# Patient Record
Sex: Male | Born: 1960 | Race: Black or African American | Hispanic: No | Marital: Married | Smoking: Never smoker
Health system: Southern US, Community
[De-identification: ages and names within clinical notes are randomized; demographics above are authoritative.]

## PROBLEM LIST (undated history)

## (undated) DIAGNOSIS — M1990 Unspecified osteoarthritis, unspecified site: Secondary | ICD-10-CM

## (undated) DIAGNOSIS — R9431 Abnormal electrocardiogram [ECG] [EKG]: Secondary | ICD-10-CM

## (undated) HISTORY — PX: ACHILLES TENDON REPAIR: SUR1153

## (undated) HISTORY — PX: TOTAL HIP ARTHROPLASTY: SHX124

---

## 2009-10-29 ENCOUNTER — Encounter: Admission: RE | Admit: 2009-10-29 | Discharge: 2009-10-29 | Payer: Self-pay | Admitting: Orthopedic Surgery

## 2010-03-23 ENCOUNTER — Ambulatory Visit (HOSPITAL_COMMUNITY)
Admission: RE | Admit: 2010-03-23 | Discharge: 2010-03-23 | Payer: Self-pay | Source: Home / Self Care | Attending: Orthopedic Surgery | Admitting: Orthopedic Surgery

## 2010-03-23 LAB — ABO/RH: ABO/RH(D): O POS

## 2010-03-23 LAB — TYPE AND SCREEN
ABO/RH(D): O POS
Antibody Screen: NEGATIVE

## 2010-04-12 LAB — URINALYSIS, ROUTINE W REFLEX MICROSCOPIC
Bilirubin Urine: NEGATIVE
Hgb urine dipstick: NEGATIVE
Ketones, ur: NEGATIVE mg/dL
Nitrite: NEGATIVE
Protein, ur: NEGATIVE mg/dL
Specific Gravity, Urine: 1.024 (ref 1.005–1.030)
Urine Glucose, Fasting: NEGATIVE mg/dL
Urobilinogen, UA: 0.2 mg/dL (ref 0.0–1.0)
pH: 6.5 (ref 5.0–8.0)

## 2010-04-12 LAB — COMPREHENSIVE METABOLIC PANEL
ALT: 52 U/L (ref 0–53)
AST: 34 U/L (ref 0–37)
Albumin: 4.2 g/dL (ref 3.5–5.2)
Alkaline Phosphatase: 57 U/L (ref 39–117)
BUN: 12 mg/dL (ref 6–23)
CO2: 30 mEq/L (ref 19–32)
Calcium: 9.7 mg/dL (ref 8.4–10.5)
Chloride: 104 mEq/L (ref 96–112)
Creatinine, Ser: 1.2 mg/dL (ref 0.4–1.5)
GFR calc Af Amer: >60 mL/min (ref >60)
GFR calc non Af Amer: >60 mL/min (ref >60)
Glucose, Bld: 126 mg/dL — ABNORMAL HIGH (ref 70–99)
Potassium: 4 mEq/L (ref 3.5–5.1)
Sodium: 143 mEq/L (ref 135–145)
Total Bilirubin: 0.7 mg/dL (ref 0.3–1.2)
Total Protein: 7.3 g/dL (ref 6.0–8.3)

## 2010-04-12 LAB — CBC
HCT: 42.1 % (ref 39.0–52.0)
Hemoglobin: 14.4 g/dL (ref 13.0–17.0)
MCH: 28.7 pg (ref 26.0–34.0)
MCHC: 34.2 g/dL (ref 30.0–36.0)
MCV: 83.9 fL (ref 78.0–100.0)
Platelets: 210 10*3/uL (ref 150–400)
RBC: 5.02 MIL/uL (ref 4.22–5.81)
RDW: 13.2 % (ref 11.5–15.5)
WBC: 5.3 10*3/uL (ref 4.0–10.5)

## 2010-04-12 LAB — PROTIME-INR
INR: 1.01 (ref 0.00–1.49)
Prothrombin Time: 13.5 seconds (ref 11.6–15.2)

## 2010-04-12 LAB — APTT: aPTT: 39 seconds — ABNORMAL HIGH (ref 24–37)

## 2010-04-15 ENCOUNTER — Inpatient Hospital Stay (HOSPITAL_COMMUNITY)
Admission: RE | Admit: 2010-04-15 | Discharge: 2010-04-19 | Disposition: A | Discharge status: Home / Self Care | Payer: Self-pay | Source: Home / Self Care | Attending: Orthopedic Surgery | Admitting: Orthopedic Surgery

## 2010-04-15 LAB — TYPE AND SCREEN
ABO/RH(D): O POS
Antibody Screen: NEGATIVE

## 2010-04-15 NOTE — H&P (Addendum)
NAMEDUSHAWN, PUSEY NO.:  [id number]  MEDICAL RECORD NO.:  [id number]          PATIENT TYPE:  INP  LOCATION:  NA                           FACILITY:  [facility]  PHYSICIAN:  Ollen Gross, M.D.    DATE OF BIRTH:  1960/07/20  DATE OF ADMISSION: DATE OF DISCHARGE:                             HISTORY & PHYSICAL   CHIEF COMPLAINT:  Left hip pain.  BRIEF HISTORY:  Mr. Vandeventer is a 50 year old male who has been seen by Dr. Lequita Halt for ongoing pain in his left hip.  He was seen in second opinion earlier last fall and was found to have advanced arthritis which has been progressing over the past year.  He is felt to be a good candidate for surgery.  Risks and benefits have been discussed with the patient and he would like to proceed with surgery.  He has been seen preoperatively by Dr. Neita Carp and felt to be safe to go ahead with surgery.  MEDICATION ALLERGIES:  No known drug allergies.  PRIMARY CARE PHYSICIAN:  Dr. Fara Chute  CURRENT MEDICATIONS:  Ultracet.  PAST MEDICAL HISTORY: 1. Hypertension. 2. Arthritis.  PAST SURGICAL HISTORY:  Achilles tendon repair in 2001.  FAMILY HISTORY:  Father passed at age of 62 of myocardial infarction. Mother is 32, she has hypertension.  SOCIAL HISTORY:  The patient is married.  He works in Firefighter.  He denies past or present use of alcohol or tobacco products.  He has 2 children.  Lives at home with his family and he does plan to go home following his hospital stay.  REVIEW OF SYSTEMS:  GENERAL:  Negative for fevers, chills or weight change.  HEENT/NEURO:  Negative for headache or blurred vision. DERMATOLOGIC:  Negative for rash or lesion.  RESPIRATORY:  Negative for shortness of breath at rest or on exertion.  CARDIOVASCULAR:  Negative for chest pain or palpitation.  GI:  Negative for nausea, vomiting, diarrhea.  GU:  Negative for hematuria, dysuria.  Musculoskeletal: Positive for joint pain.  PHYSICAL  EXAMINATION:  VITAL SIGNS:  Pulse 68, respirations 18, blood pressure 140/82 in the left arm. GENERAL:  Mr. Scharnhorst is alert and oriented x3.  He is a pleasant 50- year-old male with stated height of 5 feet 8 inches and a weight of 170 pounds.  He is in no distress. HEENT:  Normocephalic, atraumatic.  Extraocular movements intact. NECK:  Supple.  Full range of motion without lymphadenopathy. CHEST:  Lungs are clear to auscultation bilaterally without wheezes, rhonchi or rales. HEART:  Regular rate and rhythm without murmur. ABDOMEN:  Bowel sounds present in all 4 quadrants.  Abdomen is soft, nontender, nondistended to palpation. EXTREMITIES:  Left hip flexion to 95 degrees, no internal rotation, 15 degrees of external rotation, and about 20 degrees of abduction. SKIN:  Unremarkable. NEUROLOGIC:  Intact. PERIPHERAL VASCULAR:  Carotid pulses 2+ bilaterally without bruit.  RADIOGRAPHS:  AP and lateral views of the left hip reveal bone-on-bone progressive changes and cystic changes found on MRI.  IMPRESSION:  End-stage arthritis, left hip.  PLAN:  Left total hip arthroplasty to be performed by Dr.  Aluisio. Please note that Mr. Shiller was scheduled previously for the surgery but when he came in day of surgery, EKG revealed some abnormality.  He has since then cleared by Cardiology and is felt to be safe to go ahead with the left total hip arthroplasty.     Rozell Searing, PAC   ______________________________ Ollen Gross, M.D.    LD/MEDQ  D:  04/14/2010  T:  04/15/2010  Job:  [id number]  cc:   Fara Chute Fax: [phone]  Electronically Signed by Rozell Searing  on 04/15/2010 02:28:19 PM Electronically Signed by Ollen Gross M.D. on 04/20/2010 03:38:32 PM

## 2010-04-16 LAB — BASIC METABOLIC PANEL
BUN: 10 mg/dL (ref 6–23)
CO2: 29 mEq/L (ref 19–32)
Calcium: 8.9 mg/dL (ref 8.4–10.5)
Chloride: 102 mEq/L (ref 96–112)
Creatinine, Ser: 1.21 mg/dL (ref 0.4–1.5)
GFR calc Af Amer: >60 mL/min (ref >60)
GFR calc non Af Amer: >60 mL/min (ref >60)
Glucose, Bld: 128 mg/dL — ABNORMAL HIGH (ref 70–99)
Potassium: 4.5 mEq/L (ref 3.5–5.1)
Sodium: 138 mEq/L (ref 135–145)

## 2010-04-16 LAB — CBC
HCT: 35.3 % — ABNORMAL LOW (ref 39.0–52.0)
Hemoglobin: 11.6 g/dL — ABNORMAL LOW (ref 13.0–17.0)
MCH: 27.8 pg (ref 26.0–34.0)
MCHC: 32.9 g/dL (ref 30.0–36.0)
MCV: 84.7 fL (ref 78.0–100.0)
Platelets: 224 10*3/uL (ref 150–400)
RBC: 4.17 MIL/uL — ABNORMAL LOW (ref 4.22–5.81)
RDW: 13.6 % (ref 11.5–15.5)
WBC: 9.6 10*3/uL (ref 4.0–10.5)

## 2010-04-17 LAB — D-DIMER, QUANTITATIVE: D-Dimer, Quant: 1.03 ug/mL-FEU — ABNORMAL HIGH (ref 0.00–0.48)

## 2010-04-17 LAB — CARDIAC PANEL(CRET KIN+CKTOT+MB+TROPI)
CK, MB: 1.8 ng/mL (ref 0.3–4.0)
Relative Index: 0.3 (ref 0.0–2.5)
Total CK: 596 U/L — ABNORMAL HIGH (ref 7–232)
Troponin I: 0.01 ng/mL (ref 0.00–0.06)

## 2010-04-17 LAB — CBC
HCT: 32 % — ABNORMAL LOW (ref 39.0–52.0)
Hemoglobin: 10.5 g/dL — ABNORMAL LOW (ref 13.0–17.0)
MCH: 27.9 pg (ref 26.0–34.0)
MCHC: 32.8 g/dL (ref 30.0–36.0)
MCV: 85.1 fL (ref 78.0–100.0)
Platelets: 203 10*3/uL (ref 150–400)
RBC: 3.76 MIL/uL — ABNORMAL LOW (ref 4.22–5.81)
RDW: 13.8 % (ref 11.5–15.5)
WBC: 12 10*3/uL — ABNORMAL HIGH (ref 4.0–10.5)

## 2010-04-17 LAB — BASIC METABOLIC PANEL
BUN: 6 mg/dL (ref 6–23)
CO2: 32 mEq/L (ref 19–32)
Calcium: 8.7 mg/dL (ref 8.4–10.5)
Chloride: 104 mEq/L (ref 96–112)
Creatinine, Ser: 1.27 mg/dL (ref 0.4–1.5)
GFR calc Af Amer: >60 mL/min (ref >60)
GFR calc non Af Amer: >60 mL/min (ref >60)
Glucose, Bld: 133 mg/dL — ABNORMAL HIGH (ref 70–99)
Potassium: 4.2 mEq/L (ref 3.5–5.1)
Sodium: 141 mEq/L (ref 135–145)

## 2010-04-17 LAB — TSH: TSH: 1.147 u[IU]/mL (ref 0.350–4.500)

## 2010-04-18 LAB — BASIC METABOLIC PANEL
BUN: 5 mg/dL — ABNORMAL LOW (ref 6–23)
CO2: 31 mEq/L (ref 19–32)
Calcium: 8.7 mg/dL (ref 8.4–10.5)
Chloride: 105 mEq/L (ref 96–112)
Creatinine, Ser: 1.15 mg/dL (ref 0.4–1.5)
GFR calc Af Amer: >60 mL/min (ref >60)
GFR calc non Af Amer: >60 mL/min (ref >60)
Glucose, Bld: 133 mg/dL — ABNORMAL HIGH (ref 70–99)
Potassium: 4 mEq/L (ref 3.5–5.1)
Sodium: 143 mEq/L (ref 135–145)

## 2010-04-18 LAB — CBC
HCT: 30.4 % — ABNORMAL LOW (ref 39.0–52.0)
Hemoglobin: 10 g/dL — ABNORMAL LOW (ref 13.0–17.0)
MCH: 28.1 pg (ref 26.0–34.0)
MCHC: 32.9 g/dL (ref 30.0–36.0)
MCV: 85.4 fL (ref 78.0–100.0)
Platelets: 201 10*3/uL (ref 150–400)
RBC: 3.56 MIL/uL — ABNORMAL LOW (ref 4.22–5.81)
RDW: 13.7 % (ref 11.5–15.5)
WBC: 11.2 10*3/uL — ABNORMAL HIGH (ref 4.0–10.5)

## 2010-04-18 LAB — CARDIAC PANEL(CRET KIN+CKTOT+MB+TROPI)
CK, MB: 1.4 ng/mL (ref 0.3–4.0)
CK, MB: 0.9 ng/mL (ref 0.3–4.0)
Relative Index: 0.3 (ref 0.0–2.5)
Relative Index: 0.3 (ref 0.0–2.5)
Total CK: 458 U/L — ABNORMAL HIGH (ref 7–232)
Total CK: 353 U/L — ABNORMAL HIGH (ref 7–232)
Troponin I: 0.02 ng/mL (ref 0.00–0.06)
Troponin I: 0.02 ng/mL (ref 0.00–0.06)

## 2010-04-18 LAB — MAGNESIUM: Magnesium: 2 mg/dL (ref 1.5–2.5)

## 2010-04-20 NOTE — Op Note (Signed)
Jacob Henderson, Jacob Henderson NO.:  [id number]  MEDICAL RECORD NO.:  [id number]          PATIENT TYPE:  INP  LOCATION:  1605                         FACILITY:  [facility]  PHYSICIAN:  Ollen Gross, M.D.    DATE OF BIRTH:  09/20/1960  DATE OF PROCEDURE:  04/15/2010 DATE OF DISCHARGE:                              OPERATIVE REPORT   PREOPERATIVE DIAGNOSIS:  Osteoarthritis, left hip.  POSTOPERATIVE DIAGNOSIS:  Osteoarthritis, left hip.  PROCEDURE:  Left total hip arthroplasty.  SURGEON:  Ollen Gross, MD  ASSISTANT:  Alexzandrew L. Julien Girt, PA-C  ANESTHESIA:  General.  ESTIMATED BLOOD LOSS:  450.  DRAIN:  Hemovac x1.  COMPLICATIONS:  None.  CONDITION:  Stable to Recovery.  BRIEF CLINICAL NOTE:  Jacob Henderson is a 50 year old male with advanced end- stage arthritis of the left hip with progressively worsening pain and dysfunction.  He presents now for left total hip arthroplasty.  PROCEDURE IN DETAIL:  After successful administration of general anesthetic, the patient was placed in right lateral decubitus position with left side up and held with the hip positioner.  The left lower extremity was isolated from his perineum with plastic drapes and prepped and draped in a usual sterile fashion.  Short posterolateral incision was made with a 10 blade through subcutaneous tissue to the level of the fascia lata, which was incised in line with the skin incision.  The sciatic nerve was palpated and protected and short rotators and capsule were isolated off the femur.  Hip was dislocated and center of the femoral head was marked.  Trial prosthesis placed such that the center of the trial head corresponds to the center of his native femoral head. Osteotomy was marked on the femoral neck and osteotomy made with an oscillating saw.  Femoral head was removed and then the retractors were placed to gain access to the proximal femur.  A starter reamer was passed and then the canal was  thoroughly irrigated to remove fatty contents.  Axial reaming was performed to 13.5 mm, proximal reaming to 5F, and the sleeve machined to a large.  An 5F large trial sleeve was placed.  The femur was retracted anteriorly to gain acetabular exposure. Acetabular retractors were placed and labrum and osteophytes removed. Acetabular reaming was performed to 51 mm and then a 52 mm Pinnacle acetabular shell was impacted into anatomic position with outstanding purchase.  We did not need any additional dome screw fixation.  Apex hole eliminator was placed and then the 36 mm Neutral Ultamet metal liner was placed for metal-on-metal hip replacement.  We then placed trial 18 x 13 stem, 36 +8 neck matching native anteversion.  A 36 +0 head was placed.  Hip was reduced with outstanding stability.  There was full extension, full external rotation, 70 degrees of flexion, 40 degrees of abduction, 90 degrees of internal rotation, then 90 degrees of flexion, and 70 degrees of internal rotation.  By placing the left leg on top of the right, I felt as though the leg lengths were equal. Hip was then dislocated and trials were removed.  Permanent 5F large sleeve was placed, 18 x  13 stem, 36 +8 neck matching native anteversion. A 36 +0 head was placed and the hip was reduced with the same stability parameters.  Wound was copiously irrigated with saline solution and the capsule and rotators reattached to the femur through drill holes with Ethibond suture.  Fascia lata was closed over Hemovac drain with interrupted #1 Vicryl and subcutaneous closed with #1-0 and #2-0 Vicryl and subcuticular with running 4-0 Monocryl.  Catheter for the Marcaine pain pump was placed and the pump was initiated.  Incision was cleaned and dried and Steri-Strips and a bulky sterile dressing applied. Hemovac hooked to suction.  He was placed into a knee immobilizer, awakened, and transported to Recovery in stable  condition.     Ollen Gross, M.D.     FA/MEDQ  D:  04/15/2010  T:  04/16/2010  Job:  [id number]  Electronically Signed by Ollen Gross M.D. on 04/20/2010 03:38:37 PM

## 2010-05-05 NOTE — H&P (Signed)
NAMEWOODWARD, KLEM NO.:  [id number]  MEDICAL RECORD NO.:  [id number]          PATIENT TYPE:  INP  LOCATION:  1418                         FACILITY:  [facility]  PHYSICIAN:  Altha Harm, MDDATE OF BIRTH:  18-Aug-1960  DATE OF ADMISSION:  04/15/2010 DATE OF DISCHARGE:                             HISTORY & PHYSICAL   PRIMARY CARE PHYSICIAN:  Unassigned, Dr. Malen Gauze.  PRIMARY CARDIOLOGIST:  Italy Hilty, M.D., cardiology, [facility].  CHIEF COMPLAINT:  Sinus tachycardia without any chest pain.  HISTORY OF PRESENT ILLNESS:  Mr. Jacob Henderson is a 50 year old African- American male with the possible history of hypertrophic cardiomyopathy. The patient is status post left total hip arthroplasty and was noted to have heart rates in the 120s starting yesterday.  While ambulating today, he was noted to have a heart rate up to 160, which then came down back into 130s and I was asked to see the patient for further evaluation and management.  The patient denies any chest pain or shortness of breath with any of this.  He denies any dizziness, loss of consciousness.  He states that his pain is minimal at this time.  The patient relates that he was seen by Dr. 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  He states that his father probably had the same disease and died in his 63s.  The patient relates that at that time he did not have any known tachycardia and that he was not on any medications from the cardiologist.  PAST MEDICAL HISTORY:  Significant for: 1. Like I said  possible hypertrophic cardiomyopathy. 2. Arthritis. 3. Hypertension.  FAMILY HISTORY:  His father deceased prematurely at age 52 for some "heart blockage."  It is unclear as to whether or not this was vessel blockage or if he had some cardiomyopathy with outlet obstruction.  His mother is  age 41 and has hypertension.  SOCIAL HISTORY:  This gentleman is married and lives at home with his wife and 2 children.  He works in Consulting civil engineer for [employer] and he is a nondrinker and nonsmoker and no illicit drug use.  MEDICATIONS FROM HOME:  Included Ultracet.  MEDICATIONS:  Medications here in the hospital include the following: 1. Docusate. 2. Xarelto. 3. Tylenol. 4. Dulcolax. 5. Benadryl. 6. Maalox. 7. Robaxin. 8. Zofran. 9. Morphine. 10.Percocet. 11.Ultram. 12.Temazepam.  ALLERGIES:  He has no known drug allergies.  REVIEW OF SYSTEMS:  All other systems are negative.  PHYSICAL EXAMINATION:  GENERAL:  On my examination the patient is in general well-appearing, does not appear to be in any distress whatsoever and is lying comfortably in the bed.  His wife and daughter are at the bedside. HEENT:  He is normocephalic, atraumatic.  Pupils are equally round and reactive to light and accommodation.  Extraocular movements are intact. Oropharynx is moist.  No exudate, erythema or lesions are noted. NECK EXAMINATION:  Trachea is midline.  No masses, no thyromegaly.  No JVD.  No carotid bruit. RESPIRATORY EXAMINATION:  He has a normal respiratory effort with  good excursion bilaterally.  No wheezing or rhonchi noted. CARDIOVASCULAR:  He has got a definite sinus tachycardia on apical auscultation.  He has got a normal S1 and S2 and I cannot appreciate any murmurs, rubs or gallops.  PMI is nondisplaced and there are no heaves or thrills on palpation. ABDOMEN:  Obese, soft, nontender, nondistended.  No masses.  No hepatosplenomegaly noted. LYMPH NODE SURVEY:  He has got no cervical, axillary or inguinal lymphadenopathy noted. MUSCULOSKELETAL:  He has got no warmth, swelling or erythema around the joints and no spinal tenderness noted. PSYCHIATRIC:  He is alert and oriented x3.  Good insight and cognition. Good recent and remote recall.  LABORATORY DATA:  Laboratory studies  from today show sodium of 141, potassium 4.2, chloride 104, bicarb 32, BUN 6, creatinine 1.27, calcium 8.7.  His white blood cell count is 12.0, hemoglobin is 10.5, hematocrit is 32.0, platelet count was 203.  ASSESSMENT AND PLAN:  This is a patient, who presents with a sinus tachycardia.  The patient has been having T-waveinversions throughout  all the peripheral leads; however, I am unsure as to whether or not  this really represents ischemia or this is more like left ventricular  strain.  I will take a closer look at it.  However, in light of the fact  that the patient does have what is likely hypertrophic cardiomyopathy,  I will go ahead and get a set of cardiac enzymes.  Additionally, this patient is postsurgical and is at risk for clots, I will pulmonary  embolism workup going including the D-dimer.  If this is elevated, we will proceed with the computed tomography angiogram to rule out  the pulmonary  embolism.  I will also check the TSH on this patient; however, he does not give any history or signs of thyroid dysfunction. In order to adequately evaluate his heart rhythm and rate, we will move the patient to telemetry bed for now.  Pending those labs, I will got ahead and get the patient started on dose of aspirin and some metoprolol to slow down his heart rate.  I think this patient will likely be best served to have his cardiologist, Dr. Rennis Golden to see him or one of his colleagues to see him while hospitalized as they have this patient's cardiac history and the information regarding his 2-dimensional echocardiogram.  Thank you for the consult.  We will be happy to follow along with the care of this patient until resolution.     Altha Harm, MD     MAM/MEDQ  D:  04/17/2010  T:  04/17/2010  Job:  [id number]  cc:   Leonides Grills, M.D. Fax: [phone]  Italy Hilty, MD  Electronically Signed by Marthann Schiller MD on 05/05/2010 01:03:23 PM

## 2010-05-11 NOTE — Discharge Summary (Signed)
NAMEREAGEN, HABERMAN NO.:  [id number]  MEDICAL RECORD NO.:  [id number]          PATIENT TYPE:  INP  LOCATION:  1418                         FACILITY:  [facility]  PHYSICIAN:  Ollen Gross, M.D.    DATE OF BIRTH:  [birth year]  DATE OF ADMISSION:  04/15/2010 DATE OF DISCHARGE:  04/19/2010                              DISCHARGE SUMMARY   ADMITTING DIAGNOSES: 1. Osteoarthritis, left hip. 2. Hypertension. 3. Osteoarthritis.  DISCHARGE DIAGNOSES: 1. Osteoarthritis, left hip, status post left total hip replacement     arthroplasty. 2. Postop sinus tachycardia, improved. 3. Hypertension. 4. Osteoarthritis.  PROCEDURES:  April 15, 2010, left total hip.  Surgeon, Ollen Gross, MD.  Assistant, Alexzandrew L. Perkins, PA-C.  Anesthesia, general.  CONSULTATIONS:  [facility], Altha Harm, MD  CARDIOLOGY:  Italy Hilty, MD.  BRIEF HISTORY:  The patient a 50 year old male with advanced end-stage arthritis of the left hip, progressive worsening pain and dysfunction, now presents for left total hip arthroplasty.  LABORATORY DATA:  Preop CBC showed a hemoglobin of 14.4, hematocrit of 42.1, white cell count 5.3, platelets 210, postop hemoglobin 11.6 and 10.5, last noted H and H 10.0 and 30.4.  PT/INR 13.5 and 1.01 with a PTT of 39.  D-dimer taken on April 17, 2010, slightly elevated at 1.03. Chem panel on admission all within normal limits.  Serial BMETs were followed for 72 hours.  Electrolytes all remained within normal limits. Cardiac enzymes taken for 3 sets started on April 17, 2010, into April 18, 2010; on 1st set, CK elevated at 596, CK-MB normal at 1.0, index normal at 0.2.  Troponin normal at 0.01.  Second cardiac enzyme set, CK elevated at 4.58, CK-MB 1.4, index normal at 0.3 with a troponin of 0.02.  Third set, CK still elevated at 353, CK-MB normal at 0.9, index normal at 0.3.  Troponin normal at 0.02.  TSH level taken, normal at 1.147.   Blood group type O+.  EKG taken April 16, 2010, sinus tachycardia, T-wave abnormality.  Consider inferior and anterolateral ischemia, abnormal, confirmed by Dr. Roger Shelter.  EKG dated April 17, 2010, sinus bradycardia, T-wave abnormality.  Consider inferior ischemia and anterolateral ischemia, no significant change, confirmed by Dr. Charlton Haws.  EKG dated April 18, 2010, normal sinus rhythm, T- wave abnormality.  Consider inferior ischemia and anterolateral ischemia since last tracing rate is slower, confirmed by Dr. Donia Guiles.  HOSPITAL COURSE:  The patient was admitted to [facility], taken to OR, underwent above-stated procedure without complication.  The patient tolerated the procedure well, later transferred to recovery room in orthopedic floor, started on p.o. and IV analgesic pain control following the surgery given the PCA for postoperative pain control.  He is actually doing pretty well, very comfortable on the morning of day 1. We discontinued the PCA and put him on p.m. meds.  We also discontinued his Foley since he was progressing well with therapy, which he was started on postoperative, allowed to be partial weightbearing 25% to 30% by day 2.  Unfortunately developed some tachycardia.  Pulses have been in the 150-160s when he was  ambulating.  He is asymptomatic with this. [facility] hospitalist was called.  The patient was seen for sinus tachycardia without chest pain.  Cardiac enzymes were ordered and [facility] staff was consulted.  TSH and D-dimer levels for taken.  Those were normal.  He was given 1 dose of aspirin and was started on oral beta-blockers.  He was seen [facility] who found him to have sinus tach with abnormal EKG. Cardiac enzymes were being followed.  He was given IV Lopressor now for the rate, which did help and felt like he may need some calcium channel blockers.  He was  asymptomatic otherwise.  By day 3, he had continued to progress well, heart rate was much more controlled, doing better.  His D- dimer was negative.  This was felt as just sinus tachycardia postop, but did not have any other issues.  Increased his metoprolol to oral dose t.i.d. for better pain control.  Sinus tachycardia was improved with the beta blocker.  They recommended he be on Xarelto for 35 days postop.  He was seen on the following day of April 19, 2010.  He was doing well, no problems, continuing his medications and he was discharged home at that time.  DISCHARGE/PLAN:  The patient was discharged home on April 19, 2010.  DISCHARGE DIAGNOSES:  Please see above.  DISCHARGE MEDICATIONS:  Percocet, Robaxin, Xarelto, Lopressor, new medication, and over-the-counter baby aspirin.  FOLLOWUP:  He is to follow up Tuesday morning on the 14th, call for an appointment.  ACTIVITY:  Partial weightbearing, 25% to 50%.  DIET:  Heart-healthy diet.  DISPOSITION:  Home.  CONDITION ON DISCHARGE:  Improved.     Alexzandrew L. Julien Girt, P.A.C.   ______________________________ Ollen Gross, M.D.    ALP/MEDQ  D:  05/05/2010  T:  05/06/2010  Job:  [id number]  cc:   Altha Harm, MD  Italy Hilty, MD  Fara Chute Fax: [phone]  Electronically Signed by Patrica Duel P.A.C. on 05/10/2010 07:47:20 AM Electronically Signed by Ollen Gross M.D. on 05/11/2010 04:47:18 PM

## 2010-05-30 LAB — CBC
HCT: 45.3 % (ref 39.0–52.0)
Hemoglobin: 15 g/dL (ref 13.0–17.0)
MCV: 86.9 fL (ref 78.0–100.0)
RBC: 5.21 MIL/uL (ref 4.22–5.81)
RDW: 13.2 % (ref 11.5–15.5)
WBC: 6.3 10*3/uL (ref 4.0–10.5)

## 2010-05-30 LAB — URINALYSIS, ROUTINE W REFLEX MICROSCOPIC
Bilirubin Urine: NEGATIVE
Glucose, UA: NEGATIVE mg/dL
Hgb urine dipstick: NEGATIVE
Protein, ur: NEGATIVE mg/dL
Urobilinogen, UA: 0.2 mg/dL (ref 0.0–1.0)

## 2010-05-30 LAB — MRSA CULTURE

## 2010-05-30 LAB — COMPREHENSIVE METABOLIC PANEL
ALT: 33 U/L (ref 0–53)
Alkaline Phosphatase: 62 U/L (ref 39–117)
BUN: 11 mg/dL (ref 6–23)
CO2: 27 mEq/L (ref 19–32)
Chloride: 102 mEq/L (ref 96–112)
GFR calc non Af Amer: >60 mL/min (ref >60)
Glucose, Bld: 109 mg/dL — ABNORMAL HIGH (ref 70–99)
Potassium: 3.6 mEq/L (ref 3.5–5.1)
Sodium: 139 mEq/L (ref 135–145)
Total Bilirubin: 0.6 mg/dL (ref 0.3–1.2)

## 2010-05-30 LAB — PROTIME-INR
INR: 1.03 (ref 0.00–1.49)
Prothrombin Time: 13.7 seconds (ref 11.6–15.2)

## 2012-10-20 IMAGING — CT CT ANGIO CHEST
2 of 6 series · 19 of 36 positions shown · IV contrast (APPLIED)
Comparison: None.

CLINICAL DATA: Postop from left hip replacement.  Elevated D-
dimer.  Clinical suspicion for pulmonary embolism.

CT ANGIOGRAPHY CHEST WITH CONTRAST
TECHNIQUE: Multidetector CT imaging of the chest was performed
using the standard protocol during bolus administration of
intravenous contrast.  Multiplanar CT image reconstructions
including MIPs were obtained to evaluate the vascular anatomy.
Contrast:  100 ml Pmnipaque-5VV

[Series 7: thins · axial · 0.70mm/px · z∈[+1160,+1397]mm · 18 of 265 slices shown]
[im 14/265  lung]
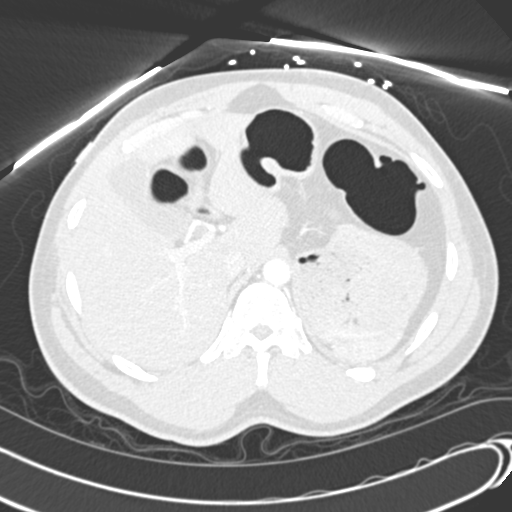
[im 27/265  mediastinal]
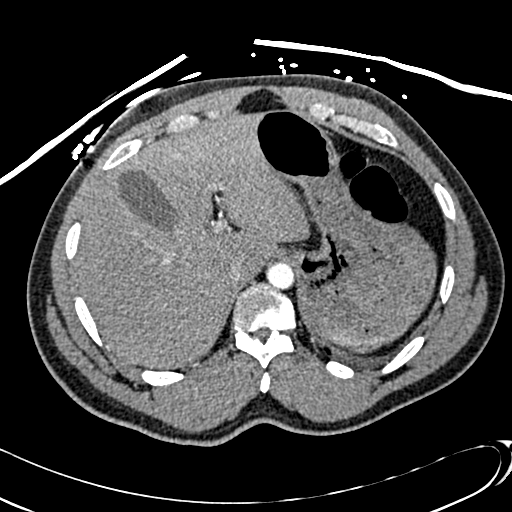
[im 40/265  lung]
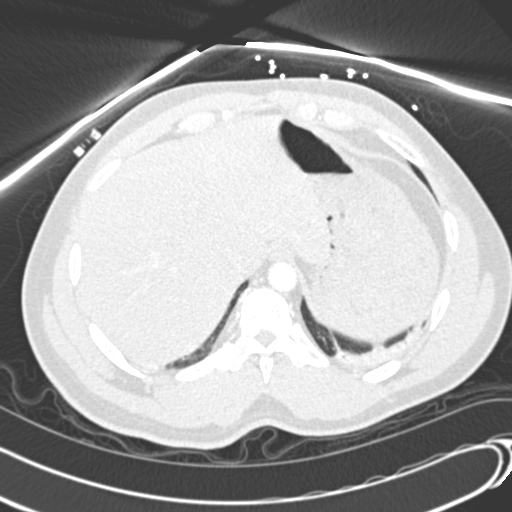
[im 53/265  mediastinal]
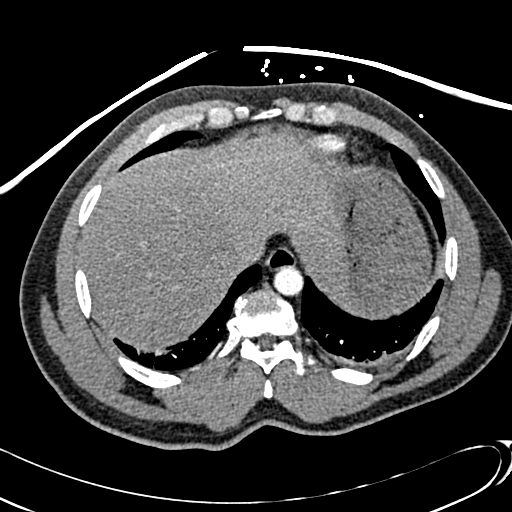
[im 67/265  lung]
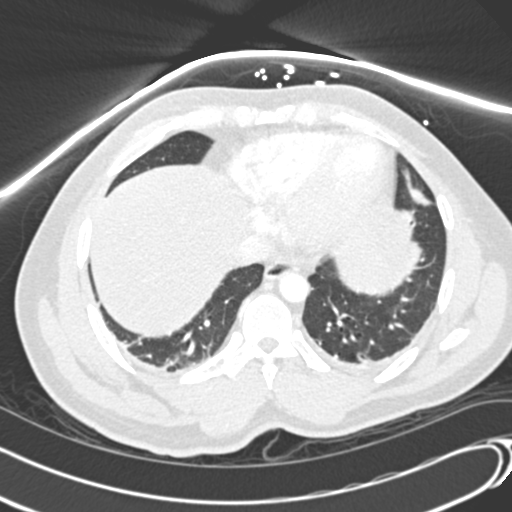
[im 80/265  mediastinal]
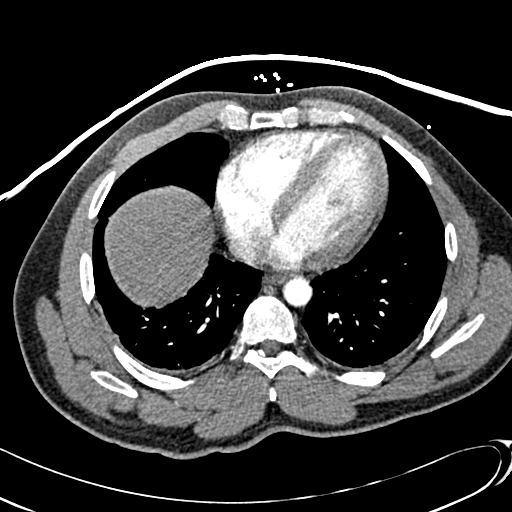
[im 93/265  lung]
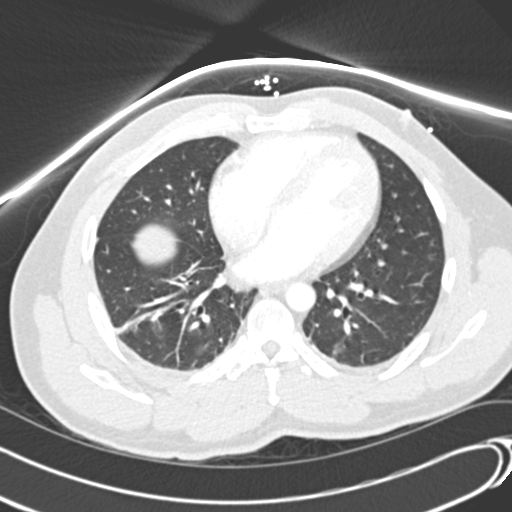
[im 106/265  mediastinal]
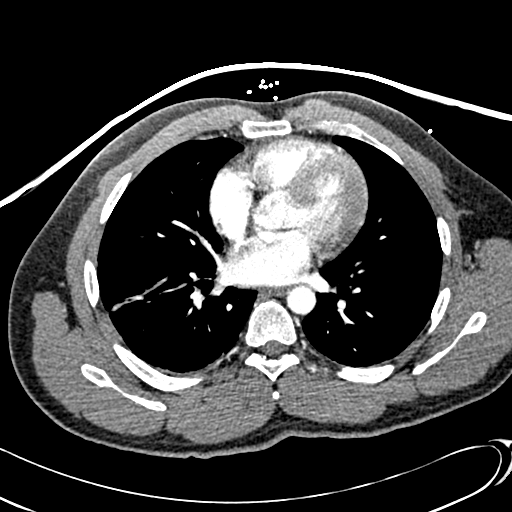
[im 119/265  lung]
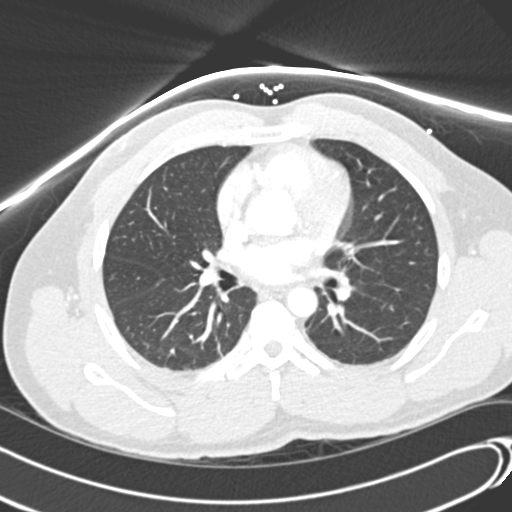
[im 146/265  mediastinal]
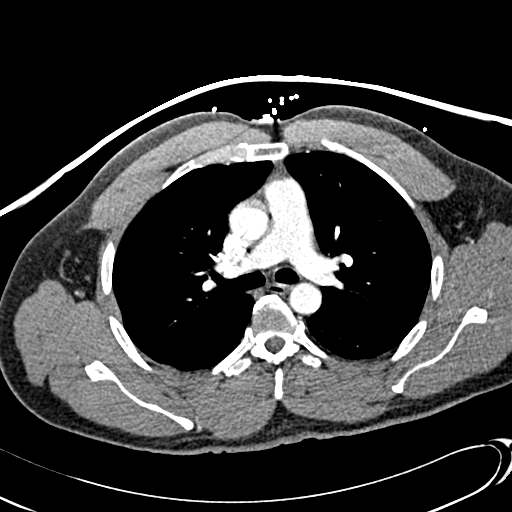
[im 159/265  lung]
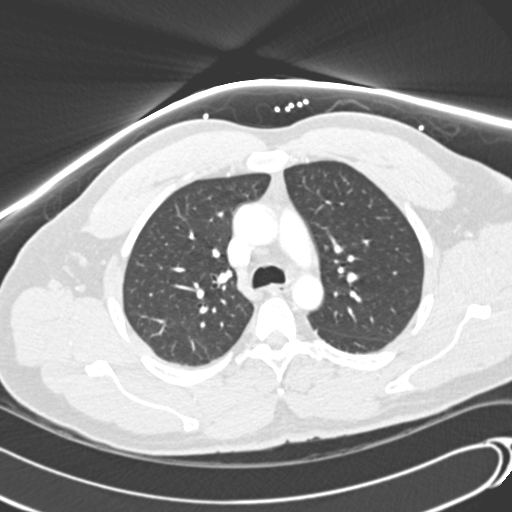
[im 172/265  mediastinal]
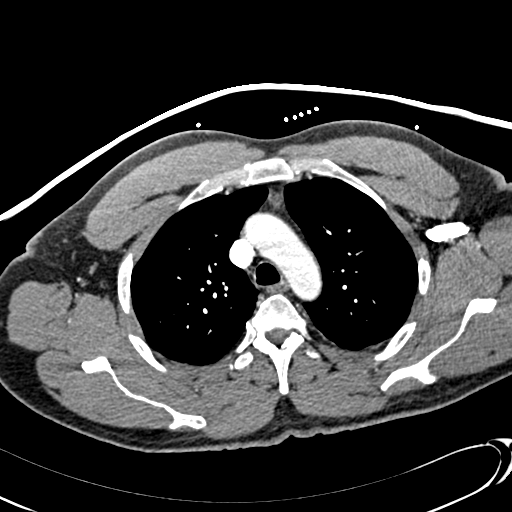
[im 185/265  lung]
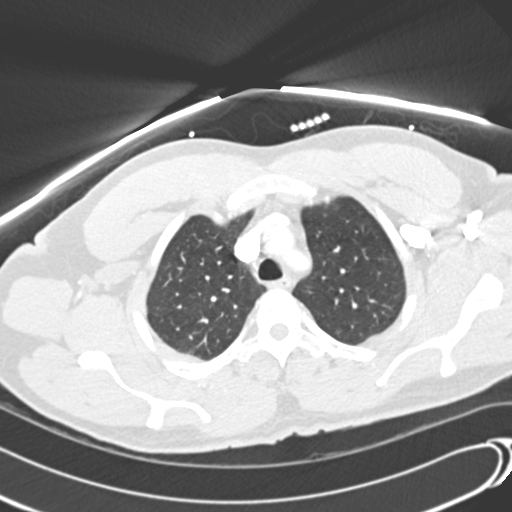
[im 199/265  mediastinal]
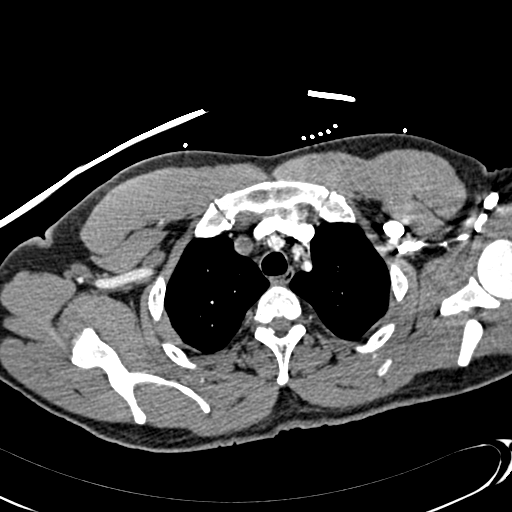
[im 212/265  lung]
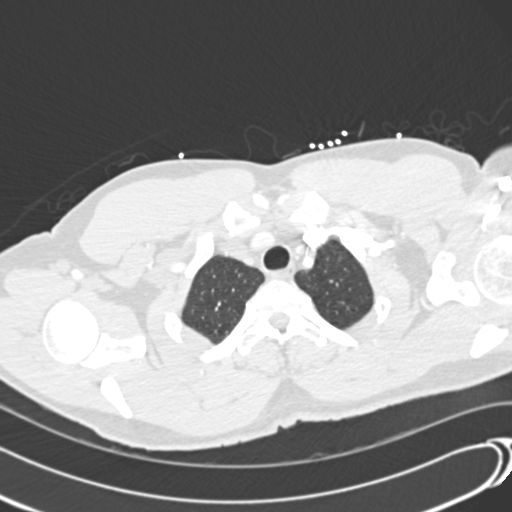
[im 225/265  mediastinal]
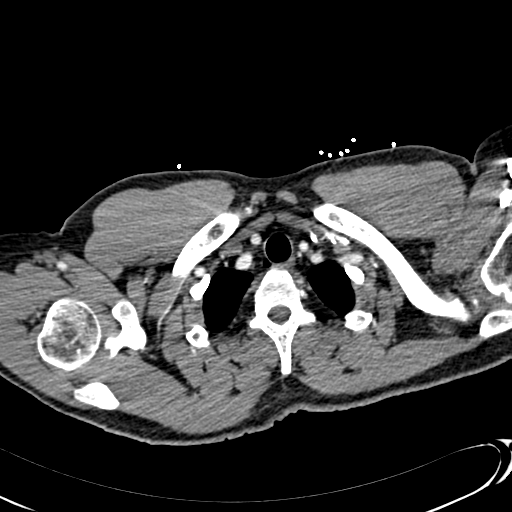
[im 238/265  lung]
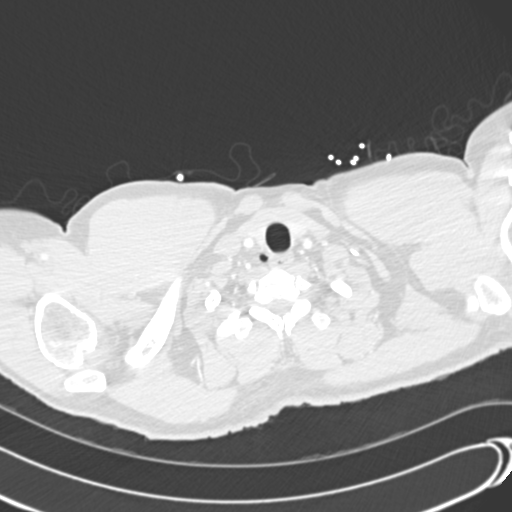
[im 251/265  mediastinal]
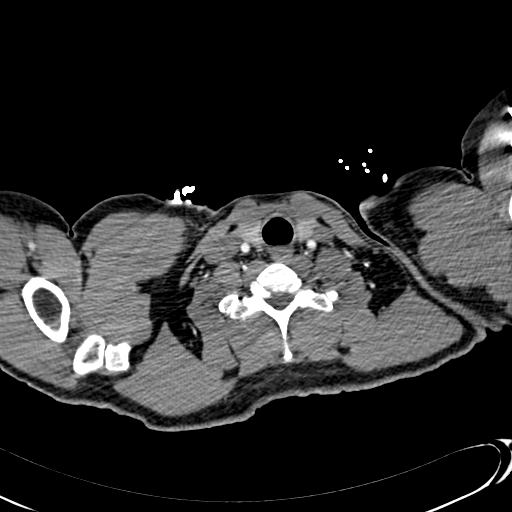

[Series 604: coronal mpr · coronal · 0.70mm/px · 1 of 94 slices shown]
[im 47/94  mediastinal]
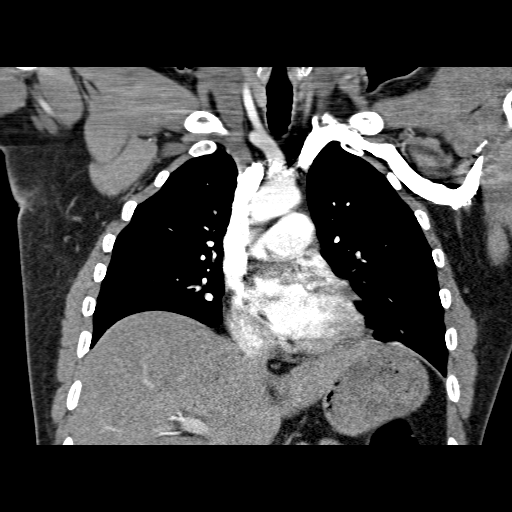

[19 of 36 positions shown; findings below may reference images not displayed]

FINDINGS: Satisfactory opacification of the pulmonary arteries
noted, and there is no evidence of pulmonary emboli.  No evidence
of thoracic aortic aneurysm or dissection.  No evidence of
mediastinal or hilar masses.  No adenopathy seen elsewhere within
the thorax.

There is no evidence of pleural or pericardial effusion.  No
evidence of pulmonary infiltrate or mass.  Mild linear opacities
seen in both lung bases which may be due to subsegmental
atelectasis or scarring.

Review of the MIP images confirms the above findings.
IMPRESSION: 1.  No evidence of pulmonary embolism.
2.  Mild bibasilar atelectasis versus scarring.

## 2013-05-20 ENCOUNTER — Telehealth (HOSPITAL_COMMUNITY): Payer: Self-pay | Admitting: *Deleted

## 2013-06-09 ENCOUNTER — Telehealth (HOSPITAL_COMMUNITY): Payer: Self-pay | Admitting: *Deleted

## 2013-06-23 ENCOUNTER — Telehealth (HOSPITAL_COMMUNITY): Payer: Self-pay | Admitting: *Deleted

## 2014-10-14 NOTE — H&P (Signed)
NTS SOAP Note  Vital Signs:  Vitals as of: 10/13/2014: Systolic 132: Diastolic 81: Heart Rate 95: Temp 97.33F: Height 10ft 8in: Weight 179Lbs 0 Ounces: BMI 27.22  BMI : 27.22 kg/m2  Subjective: This 54 year old male presents for of biliary colic secondary to known cholelithiasis.  Has had several episodes of biliary colic.  No fever, chills, jaundice.  Does have some fatty food intolerance.  Review of Symptoms:  Constitutional:unremarkable   Head:unremarkable Eyes:unremarkable   Nose/Mouth/Throat:unremarkable Cardiovascular:  unremarkable Respiratory:unremarkable Gastrointestinabdominal pain Genitourinary:unremarkable   Musculoskeletal:unremarkable Skin:unremarkable Hematolgic/Lymphatic:unremarkable   Allergic/Immunologic:unremarkable   Past Medical History:  Reviewed  Past Medical History  Surgical History: total hip replacement, achilles tendon repair Medical Problems: none Allergies: nkda Medications: none   Social History:Reviewed  Social History  Preferred Language: English Race:  White Ethnicity: Not Hispanic / Latino Age: 38 Years 8 Months Marital Status:  M Alcohol: no Recreational drug(s): no   Smoking Status: Never smoker reviewed on 10/13/2014 Functional Status reviewed on 10/13/2014 ------------------------------------------------ Bathing: Normal Cooking: Normal Dressing: Normal Driving: Normal Eating: Normal Managing Meds: Normal Oral Care: Normal Shopping: Normal Toileting: Normal Transferring: Normal Walking: Normal Cognitive Status reviewed on 10/13/2014 ------------------------------------------------ Attention: Normal Decision Making: Normal Language: Normal Memory: Normal Motor: Normal Perception: Normal Problem Solving: Normal Visual and Spatial: Normal   Family History:Reviewed  Family Health History Mother, Living; Healthy; healthy Father, Deceased; Heart attack (myocardial infarction);      Objective Information: General:Well appearing, well nourished in no distress. Heart:RRR, no murmur Lungs:  CTA bilaterally, no wheezes, rhonchi, rales.  Breathing unlabored. Abdomen:Soft, NT/ND, no HSM, no masses.  Assessment:cholelithiasis  Diagnoses: 574.20  K80.20 Gallstone (Calculus of gallbladder without cholecystitis without obstruction)  Procedures: 16109 - OFFICE OUTPATIENT VISIT 15 MINUTES    Plan:  Scheduled for laparoscopic cholecystectomy on 10/30/14.   Patient Education:Alternative treatments to surgery were discussed with patient (and family).  Risks and benefits  of procedure including bleeding, infection, hepatobiliary injury, and the possibility of an open procedure were fully explained to the patient (and family) who gave informed consent. Patient/family questions were addressed.  Follow-up:Pending Surgery

## 2014-10-26 NOTE — Patient Instructions (Signed)
Jacob Henderson  10/26/2014     @   Your procedure is scheduled on 10/30/2014  Report to [facility] at  730 A.M.  Call this number if you have problems the morning of surgery:  [phone]   Remember:  Do not eat food or drink liquids after midnight.  Take these medicines the morning of surgery with A SIP OF WATER  none   Do not wear jewelry, make-up or nail polish.  Do not wear lotions, powders, or perfumes.    Do not shave 48 hours prior to surgery.  Men may shave face and neck.  Do not bring valuables to the hospital.  [facility] is not responsible for any belongings or valuables.  Contacts, dentures or bridgework may not be worn into surgery.  Leave your suitcase in the car.  After surgery it may be brought to your room.  For patients admitted to the hospital, discharge time will be determined by your treatment team.  Patients discharged the day of surgery will not be allowed to drive home.   Name and phone number of your driver:   family Special instructions:  none  Please read over the following fact sheets that you were given. Pain Booklet, Coughing and Deep Breathing, Surgical Site Infection Prevention, Anesthesia Post-op Instructions and Care and Recovery After Surgery      Laparoscopic Cholecystectomy Laparoscopic cholecystectomy is surgery to remove the gallbladder. The gallbladder is located in the upper right part of the abdomen, behind the liver. It is a storage sac for bile produced in the liver. Bile aids in the digestion and absorption of fats. Cholecystectomy is often done for inflammation of the gallbladder (cholecystitis). This condition is usually caused by a buildup of gallstones (cholelithiasis) in your gallbladder. Gallstones can block the flow of bile, resulting in inflammation and pain. In severe cases, emergency surgery may be required. When emergency surgery is not required, you will have time to prepare for the  procedure. Laparoscopic surgery is an alternative to open surgery. Laparoscopic surgery has a shorter recovery time. Your common bile duct may also need to be examined during the procedure. If stones are found in the common bile duct, they may be removed. LET [facility] CARE PROVIDER KNOW ABOUT:  Any allergies you have.  All medicines you are taking, including vitamins, herbs, eye drops, creams, and over-the-counter medicines.  Previous problems you or members of your family have had with the use of anesthetics.  Any blood disorders you have.  Previous surgeries you have had.  Medical conditions you have. RISKS AND COMPLICATIONS Generally, this is a safe procedure. However, as with any procedure, complications can occur. Possible complications include:  Infection.  Damage to the common bile duct, nerves, arteries, veins, or other internal organs such as the stomach, liver, or intestines.  Bleeding.  A stone may remain in the common bile duct.  A bile leak from the cyst duct that is clipped when your gallbladder is removed.  The need to convert to open surgery, which requires a larger incision in the abdomen. This may be necessary if your surgeon thinks it is not safe to continue with a laparoscopic procedure. BEFORE THE PROCEDURE  Ask your health care provider about changing or stopping any regular medicines. You will need to stop taking aspirin or blood thinners at least 5 days prior to surgery.  Do not eat or drink anything after midnight the night before surgery.  Let your  health care provider know if you develop a cold or other infectious problem before surgery. PROCEDURE   You will be given medicine to make you sleep through the procedure (general anesthetic). A breathing tube will be placed in your mouth.  When you are asleep, your surgeon will make several small cuts (incisions) in your abdomen.  A thin, lighted tube with a tiny camera on the end (laparoscope) is  inserted through one of the small incisions. The camera on the laparoscope sends a picture to a TV screen in the operating room. This gives the surgeon a good view inside your abdomen.  A gas will be pumped into your abdomen. This expands your abdomen so that the surgeon has more room to perform the surgery.  Other tools needed for the procedure are inserted through the other incisions. The gallbladder is removed through one of the incisions.  After the removal of your gallbladder, the incisions will be closed with stitches, staples, or skin glue. AFTER THE PROCEDURE  You will be taken to a recovery area where your progress will be checked often.  You may be allowed to go home the same day if your pain is controlled and you can tolerate liquids. Document Released: 03/06/2005 Document Revised: 12/25/2012 Document Reviewed: 10/16/2012 [facility] Patient Information 2015 [place], [place]. This information is not intended to replace advice given to you by your health care provider. Make sure you discuss any questions you have with your health care provider. PATIENT INSTRUCTIONS POST-ANESTHESIA  IMMEDIATELY FOLLOWING SURGERY:  Do not drive or operate machinery for the first twenty four hours after surgery.  Do not make any important decisions for twenty four hours after surgery or while taking narcotic pain medications or sedatives.  If you develop intractable nausea and vomiting or a severe headache please notify your doctor immediately.  FOLLOW-UP:  Please make an appointment with your surgeon as instructed. You do not need to follow up with anesthesia unless specifically instructed to do so.  WOUND CARE INSTRUCTIONS (if applicable):  Keep a dry clean dressing on the anesthesia/puncture wound site if there is drainage.  Once the wound has quit draining you may leave it open to air.  Generally you should leave the bandage intact for twenty four hours unless there is drainage.  If the epidural site  drains for more than 36-48 hours please call the anesthesia department.  QUESTIONS?:  Please feel free to call your physician or the hospital operator if you have any questions, and they will be happy to assist you.

## 2014-10-27 ENCOUNTER — Encounter (HOSPITAL_COMMUNITY): Payer: Self-pay

## 2014-10-27 ENCOUNTER — Other Ambulatory Visit: Payer: Self-pay

## 2014-10-27 ENCOUNTER — Encounter (HOSPITAL_COMMUNITY)
Admission: RE | Admit: 2014-10-27 | Discharge: 2014-10-27 | Disposition: A | Discharge status: Home / Self Care | Payer: Managed Care, Other (non HMO) | Source: Ambulatory Visit | Attending: General Surgery | Admitting: General Surgery

## 2014-10-27 DIAGNOSIS — K8020 Calculus of gallbladder without cholecystitis without obstruction: Secondary | ICD-10-CM | POA: Insufficient documentation

## 2014-10-27 DIAGNOSIS — Z01818 Encounter for other preprocedural examination: Secondary | ICD-10-CM | POA: Insufficient documentation

## 2014-10-27 HISTORY — DX: Unspecified osteoarthritis, unspecified site: M19.90

## 2014-10-27 HISTORY — DX: Abnormal electrocardiogram (ECG) (EKG): R94.31

## 2014-10-27 LAB — BASIC METABOLIC PANEL
Anion gap: 7 (ref 5–15)
BUN: 12 mg/dL (ref 6–20)
CALCIUM: 9.8 mg/dL (ref 8.9–10.3)
CHLORIDE: 106 mmol/L (ref 101–111)
CO2: 29 mmol/L (ref 22–32)
CREATININE: 1.27 mg/dL — AB (ref 0.61–1.24)
Glucose, Bld: 106 mg/dL — ABNORMAL HIGH (ref 65–99)
POTASSIUM: 4.3 mmol/L (ref 3.5–5.1)
Sodium: 142 mmol/L (ref 135–145)

## 2014-10-27 LAB — HEPATIC FUNCTION PANEL
ALBUMIN: 4.4 g/dL (ref 3.5–5.0)
ALT: 42 U/L (ref 17–63)
AST: 28 U/L (ref 15–41)
Alkaline Phosphatase: 68 U/L (ref 38–126)
BILIRUBIN INDIRECT: 0.3 mg/dL (ref 0.3–0.9)
Bilirubin, Direct: 0.1 mg/dL (ref 0.1–0.5)
TOTAL PROTEIN: 7.1 g/dL (ref 6.5–8.1)
Total Bilirubin: 0.4 mg/dL (ref 0.3–1.2)

## 2014-10-27 LAB — CBC WITH DIFFERENTIAL/PLATELET
BASOS ABS: 0 10*3/uL (ref 0.0–0.1)
BASOS PCT: 1 % (ref 0–1)
EOS ABS: 0.2 10*3/uL (ref 0.0–0.7)
EOS PCT: 3 % (ref 0–5)
HEMATOCRIT: 44.2 % (ref 39.0–52.0)
Hemoglobin: 14.4 g/dL (ref 13.0–17.0)
LYMPHS ABS: 1.4 10*3/uL (ref 0.7–4.0)
LYMPHS PCT: 21 % (ref 12–46)
MCH: 28.4 pg (ref 26.0–34.0)
MCHC: 32.6 g/dL (ref 30.0–36.0)
MCV: 87.2 fL (ref 78.0–100.0)
Monocytes Absolute: 0.5 10*3/uL (ref 0.1–1.0)
Monocytes Relative: 7 % (ref 3–12)
NEUTROS PCT: 68 % (ref 43–77)
Neutro Abs: 4.7 10*3/uL (ref 1.7–7.7)
Platelets: 283 10*3/uL (ref 150–400)
RBC: 5.07 MIL/uL (ref 4.22–5.81)
RDW: 14 % (ref 11.5–15.5)
WBC: 6.8 10*3/uL (ref 4.0–10.5)

## 2014-10-27 NOTE — Pre-Procedure Instructions (Signed)
Patient given information to sign up for my chart at home. 

## 2014-10-30 ENCOUNTER — Encounter (HOSPITAL_COMMUNITY)
Admission: RE | Disposition: A | Discharge status: Home / Self Care | Payer: Self-pay | Source: Ambulatory Visit | Attending: General Surgery

## 2014-10-30 ENCOUNTER — Encounter (HOSPITAL_COMMUNITY): Payer: Self-pay | Admitting: Anesthesiology

## 2014-10-30 ENCOUNTER — Ambulatory Visit (HOSPITAL_COMMUNITY)
Admission: RE | Admit: 2014-10-30 | Discharge: 2014-10-30 | Disposition: A | Discharge status: Home / Self Care | Payer: Managed Care, Other (non HMO) | Source: Ambulatory Visit | Attending: General Surgery | Admitting: General Surgery

## 2014-10-30 ENCOUNTER — Ambulatory Visit (HOSPITAL_COMMUNITY): Payer: Managed Care, Other (non HMO) | Admitting: Anesthesiology

## 2014-10-30 ENCOUNTER — Ambulatory Visit (HOSPITAL_COMMUNITY): Payer: Managed Care, Other (non HMO)

## 2014-10-30 DIAGNOSIS — K8010 Calculus of gallbladder with chronic cholecystitis without obstruction: Secondary | ICD-10-CM | POA: Insufficient documentation

## 2014-10-30 DIAGNOSIS — Z96649 Presence of unspecified artificial hip joint: Secondary | ICD-10-CM | POA: Insufficient documentation

## 2014-10-30 DIAGNOSIS — M1990 Unspecified osteoarthritis, unspecified site: Secondary | ICD-10-CM | POA: Diagnosis not present

## 2014-10-30 HISTORY — PX: CHOLECYSTECTOMY: SHX55

## 2014-10-30 SURGERY — LAPAROSCOPIC CHOLECYSTECTOMY
Anesthesia: General

## 2014-10-30 MED ORDER — SUCCINYLCHOLINE CHLORIDE 20 MG/ML IJ SOLN
INTRAMUSCULAR | Status: AC
  Filled 2014-10-30: qty 1

## 2014-10-30 MED ORDER — LIDOCAINE HCL 1 % IJ SOLN
INTRAMUSCULAR | Status: DC | PRN
  Administered 2014-10-30: 40 mg via INTRADERMAL

## 2014-10-30 MED ORDER — FENTANYL CITRATE (PF) 100 MCG/2ML IJ SOLN
INTRAMUSCULAR | Status: AC
  Filled 2014-10-30: qty 4

## 2014-10-30 MED ORDER — ONDANSETRON HCL 4 MG/2ML IJ SOLN: 4.0000 mg | Freq: Once | INTRAMUSCULAR | Status: DC | PRN

## 2014-10-30 MED ORDER — POVIDONE-IODINE 10 % OINT PACKET
TOPICAL_OINTMENT | CUTANEOUS | Status: DC | PRN
  Administered 2014-10-30: 1 via TOPICAL

## 2014-10-30 MED ORDER — GLYCOPYRROLATE 0.2 MG/ML IJ SOLN
INTRAMUSCULAR | Status: AC
  Filled 2014-10-30: qty 2

## 2014-10-30 MED ORDER — LIDOCAINE HCL (PF) 1 % IJ SOLN
INTRAMUSCULAR | Status: AC
  Filled 2014-10-30: qty 5

## 2014-10-30 MED ORDER — ROCURONIUM BROMIDE 50 MG/5ML IV SOLN
INTRAVENOUS | Status: AC
  Filled 2014-10-30: qty 1

## 2014-10-30 MED ORDER — FENTANYL CITRATE (PF) 100 MCG/2ML IJ SOLN
INTRAMUSCULAR | Status: DC | PRN
  Administered 2014-10-30 (×5): 50 ug via INTRAVENOUS

## 2014-10-30 MED ORDER — ONDANSETRON HCL 4 MG/2ML IJ SOLN
4.0000 mg | Freq: Once | INTRAMUSCULAR | Status: AC
  Administered 2014-10-30: 4 mg via INTRAVENOUS

## 2014-10-30 MED ORDER — BUPIVACAINE HCL (PF) 0.5 % IJ SOLN
INTRAMUSCULAR | Status: AC
  Filled 2014-10-30: qty 30

## 2014-10-30 MED ORDER — KETOROLAC TROMETHAMINE 30 MG/ML IJ SOLN
30.0000 mg | Freq: Once | INTRAMUSCULAR | Status: AC
  Administered 2014-10-30: 30 mg via INTRAVENOUS

## 2014-10-30 MED ORDER — FENTANYL CITRATE (PF) 100 MCG/2ML IJ SOLN
INTRAMUSCULAR | Status: AC
  Filled 2014-10-30: qty 2

## 2014-10-30 MED ORDER — ROCURONIUM BROMIDE 100 MG/10ML IV SOLN
INTRAVENOUS | Status: DC | PRN
  Administered 2014-10-30: 30 mg via INTRAVENOUS
  Administered 2014-10-30: 10 mg via INTRAVENOUS

## 2014-10-30 MED ORDER — CHLORHEXIDINE GLUCONATE 4 % EX LIQD: 1.0000 "application " | Freq: Once | CUTANEOUS | Status: DC

## 2014-10-30 MED ORDER — FENTANYL CITRATE (PF) 100 MCG/2ML IJ SOLN
25.0000 ug | INTRAMUSCULAR | Status: DC | PRN
  Administered 2014-10-30 (×2): 25 ug via INTRAVENOUS

## 2014-10-30 MED ORDER — NEOSTIGMINE METHYLSULFATE 10 MG/10ML IV SOLN
INTRAVENOUS | Status: DC | PRN
  Administered 2014-10-30: 3 mg via INTRAVENOUS

## 2014-10-30 MED ORDER — KETOROLAC TROMETHAMINE 30 MG/ML IJ SOLN
INTRAMUSCULAR | Status: AC
  Filled 2014-10-30: qty 1

## 2014-10-30 MED ORDER — DEXAMETHASONE SODIUM PHOSPHATE 4 MG/ML IJ SOLN
4.0000 mg | Freq: Once | INTRAMUSCULAR | Status: AC
  Administered 2014-10-30: 4 mg via INTRAVENOUS

## 2014-10-30 MED ORDER — CIPROFLOXACIN IN D5W 400 MG/200ML IV SOLN
INTRAVENOUS | Status: AC
  Filled 2014-10-30: qty 200

## 2014-10-30 MED ORDER — BUPIVACAINE HCL (PF) 0.5 % IJ SOLN
INTRAMUSCULAR | Status: DC | PRN
  Administered 2014-10-30: 10 mL

## 2014-10-30 MED ORDER — OXYCODONE-ACETAMINOPHEN 7.5-325 MG PO TABS: 1.0000 | ORAL_TABLET | ORAL | Status: DC | PRN

## 2014-10-30 MED ORDER — FENTANYL CITRATE (PF) 250 MCG/5ML IJ SOLN
INTRAMUSCULAR | Status: AC
  Filled 2014-10-30: qty 25

## 2014-10-30 MED ORDER — PROPOFOL 10 MG/ML IV BOLUS
INTRAVENOUS | Status: DC | PRN
  Administered 2014-10-30: 150 mg via INTRAVENOUS

## 2014-10-30 MED ORDER — SODIUM CHLORIDE 0.9 % IR SOLN
Status: DC | PRN
  Administered 2014-10-30: 1000 mL

## 2014-10-30 MED ORDER — GLYCOPYRROLATE 0.2 MG/ML IJ SOLN
INTRAMUSCULAR | Status: DC | PRN
  Administered 2014-10-30: .5 mg via INTRAVENOUS

## 2014-10-30 MED ORDER — POVIDONE-IODINE 10 % EX OINT
TOPICAL_OINTMENT | CUTANEOUS | Status: AC
  Filled 2014-10-30: qty 1

## 2014-10-30 MED ORDER — MIDAZOLAM HCL 2 MG/2ML IJ SOLN
1.0000 mg | INTRAMUSCULAR | Status: DC | PRN
  Administered 2014-10-30: 2 mg via INTRAVENOUS

## 2014-10-30 MED ORDER — MIDAZOLAM HCL 2 MG/2ML IJ SOLN
INTRAMUSCULAR | Status: AC
  Filled 2014-10-30: qty 2

## 2014-10-30 MED ORDER — LACTATED RINGERS IV SOLN
INTRAVENOUS | Status: DC
  Administered 2014-10-30 (×2): via INTRAVENOUS

## 2014-10-30 MED ORDER — CIPROFLOXACIN IN D5W 400 MG/200ML IV SOLN
400.0000 mg | INTRAVENOUS | Status: AC
  Administered 2014-10-30: 400 mg via INTRAVENOUS

## 2014-10-30 MED ORDER — DEXAMETHASONE SODIUM PHOSPHATE 4 MG/ML IJ SOLN
INTRAMUSCULAR | Status: AC
  Filled 2014-10-30: qty 1

## 2014-10-30 MED ORDER — HEMOSTATIC AGENTS (NO CHARGE) OPTIME
TOPICAL | Status: DC | PRN
  Administered 2014-10-30: 1 via TOPICAL

## 2014-10-30 MED ORDER — MIDAZOLAM HCL 2 MG/2ML IJ SOLN
INTRAMUSCULAR | Status: AC
  Filled 2014-10-30: qty 4

## 2014-10-30 MED ORDER — ONDANSETRON HCL 4 MG/2ML IJ SOLN
INTRAMUSCULAR | Status: AC
  Filled 2014-10-30: qty 2

## 2014-10-30 SURGICAL SUPPLY — 42 items
APPLIER CLIP LAPSCP 10X32 DD (CLIP) ×2 IMPLANT
BAG HAMPER (MISCELLANEOUS) ×2 IMPLANT
CHLORAPREP W/TINT 26ML (MISCELLANEOUS) ×2 IMPLANT
CLOTH BEACON ORANGE TIMEOUT ST (SAFETY) ×2 IMPLANT
COVER LIGHT HANDLE STERIS (MISCELLANEOUS) ×4 IMPLANT
DECANTER SPIKE VIAL GLASS SM (MISCELLANEOUS) ×2 IMPLANT
ELECT REM PT RETURN 9FT ADLT (ELECTROSURGICAL) ×2
ELECTRODE REM PT RTRN 9FT ADLT (ELECTROSURGICAL) ×1 IMPLANT
FILTER SMOKE EVAC LAPAROSHD (FILTER) ×2 IMPLANT
FORMALIN 10 PREFIL 120ML (MISCELLANEOUS) ×2 IMPLANT
GLOVE BIOGEL PI IND STRL 7.0 (GLOVE) ×1 IMPLANT
GLOVE BIOGEL PI IND STRL 7.5 (GLOVE) ×1 IMPLANT
GLOVE BIOGEL PI INDICATOR 7.0 (GLOVE) ×1
GLOVE BIOGEL PI INDICATOR 7.5 (GLOVE) ×1
GLOVE ECLIPSE 7.0 STRL STRAW (GLOVE) ×4 IMPLANT
GLOVE SURG SS PI 7.5 STRL IVOR (GLOVE) ×4 IMPLANT
GOWN STRL REUS W/ TWL XL LVL3 (GOWN DISPOSABLE) ×1 IMPLANT
GOWN STRL REUS W/TWL LRG LVL3 (GOWN DISPOSABLE) ×4 IMPLANT
GOWN STRL REUS W/TWL XL LVL3 (GOWN DISPOSABLE) ×1
HEMOSTAT SNOW SURGICEL 2X4 (HEMOSTASIS) ×2 IMPLANT
INST SET LAPROSCOPIC AP (KITS) ×2 IMPLANT
IV NS IRRIG 3000ML ARTHROMATIC (IV SOLUTION) ×2 IMPLANT
KIT ROOM TURNOVER APOR (KITS) ×2 IMPLANT
MANIFOLD NEPTUNE II (INSTRUMENTS) ×2 IMPLANT
NEEDLE INSUFFLATION 14GA 120MM (NEEDLE) ×2 IMPLANT
NS IRRIG 1000ML POUR BTL (IV SOLUTION) ×2 IMPLANT
PACK LAP CHOLE LZT030E (CUSTOM PROCEDURE TRAY) ×2 IMPLANT
PAD ARMBOARD 7.5X6 YLW CONV (MISCELLANEOUS) ×2 IMPLANT
POUCH SPECIMEN RETRIEVAL 10MM (ENDOMECHANICALS) ×2 IMPLANT
SET BASIN LINEN APH (SET/KITS/TRAYS/PACK) ×2 IMPLANT
SET IRRIG TUBING LAPAROSCOPIC (IRRIGATION / IRRIGATOR) ×2 IMPLANT
SLEEVE ENDOPATH XCEL 5M (ENDOMECHANICALS) ×2 IMPLANT
SPONGE GAUZE 2X2 8PLY STRL LF (GAUZE/BANDAGES/DRESSINGS) ×8 IMPLANT
STAPLER VISISTAT (STAPLE) ×2 IMPLANT
SUT VICRYL 0 UR6 27IN ABS (SUTURE) ×2 IMPLANT
TAPE CLOTH SURG 4X10 WHT LF (GAUZE/BANDAGES/DRESSINGS) ×2 IMPLANT
TROCAR ENDO BLADELESS 11MM (ENDOMECHANICALS) ×2 IMPLANT
TROCAR XCEL NON-BLD 5MMX100MML (ENDOMECHANICALS) ×2 IMPLANT
TROCAR XCEL UNIV SLVE 11M 100M (ENDOMECHANICALS) ×2 IMPLANT
TUBING INSUFFLATION (TUBING) ×2 IMPLANT
WARMER LAPAROSCOPE (MISCELLANEOUS) ×2 IMPLANT
YANKAUER SUCT 12FT TUBE ARGYLE (SUCTIONS) ×2 IMPLANT

## 2014-10-30 NOTE — Anesthesia Postprocedure Evaluation (Deleted)
  Anesthesia Post-op Note  Patient: Jacob Henderson  Procedure(s) Performed: Procedure(s): LAPAROSCOPIC CHOLECYSTECTOMY (N/A)  Patient Location: PACU  Anesthesia Type:General  Level of Consciousness: awake, alert  and oriented  Airway and Oxygen Therapy: Patient Spontanous Breathing  Post-op Pain: mild  Post-op Assessment: Post-op Vital signs reviewed, Patient's Cardiovascular Status Stable, Respiratory Function Stable, Patent Airway and No signs of Nausea or vomiting              Post-op Vital Signs: Reviewed and stable  Last Vitals:  Filed Vitals:   10/30/14 0800  BP: 139/85  Temp:   Resp: 0    Complications: No apparent anesthesia complications

## 2014-10-30 NOTE — Transfer of Care (Signed)
Immediate Anesthesia Transfer of Care Note  Patient: Jacob Henderson  Procedure(s) Performed: Procedure(s): LAPAROSCOPIC CHOLECYSTECTOMY (N/A)  Patient Location: PACU  Anesthesia Type:General  Level of Consciousness: sedated  Airway & Oxygen Therapy: Patient Spontanous Breathing and Patient connected to face mask oxygen  Post-op Assessment: Report given to RN  Post vital signs: Reviewed and stable  Last Vitals:  Filed Vitals:   10/30/14 0800  BP: 139/85  Temp:   Resp: 0    Complications: No apparent anesthesia complications

## 2014-10-30 NOTE — Anesthesia Procedure Notes (Signed)
Procedure Name: Intubation Date/Time: 10/30/2014 9:19 AM Performed by: Glynn Octave E Pre-anesthesia Checklist: Patient identified, Patient being monitored, Timeout performed, Emergency Drugs available and Suction available Patient Re-evaluated:Patient Re-evaluated prior to inductionOxygen Delivery Method: Circle System Utilized Preoxygenation: Pre-oxygenation with 100% oxygen Intubation Type: IV induction Ventilation: Mask ventilation without difficulty Laryngoscope Size: Mac and 3 Grade View: Grade I Tube type: Oral Tube size: 7.0 mm Number of attempts: 1 Airway Equipment and Method: Stylet Placement Confirmation: ETT inserted through vocal cords under direct vision,  positive ETCO2 and breath sounds checked- equal and bilateral Secured at: 22 cm Tube secured with: Tape Dental Injury: Teeth and Oropharynx as per pre-operative assessment

## 2014-10-30 NOTE — Discharge Instructions (Signed)

## 2014-10-30 NOTE — Anesthesia Postprocedure Evaluation (Signed)
  Anesthesia Post-op Note  Patient: Jacob Henderson  Procedure(s) Performed: Procedure(s): LAPAROSCOPIC CHOLECYSTECTOMY (N/A)  Patient Location: PACU  Anesthesia Type:General  Level of Consciousness: awake, alert  and oriented  Airway and Oxygen Therapy: Patient Spontanous Breathing  Post-op Pain: mild  Post-op Assessment: Post-op Vital signs reviewed, Patient's Cardiovascular Status Stable, Respiratory Function Stable, Patent Airway and No signs of Nausea or vomiting              Post-op Vital Signs: Reviewed and stable  Last Vitals:  Filed Vitals:   10/30/14 1123  BP: 145/98  Pulse: 91  Temp: 36.7 C  Resp: 18    Complications: No apparent anesthesia complications

## 2014-10-30 NOTE — Anesthesia Preprocedure Evaluation (Signed)
Anesthesia Evaluation  Patient identified by MRN, date of birth, ID band Patient awake    Reviewed: Allergy & Precautions, NPO status , Patient's Chart, lab work & pertinent test results  Airway Mallampati: II  TM Distance: >3 FB     Dental  (+) Teeth Intact   Pulmonary neg pulmonary ROS,  breath sounds clear to auscultation        Cardiovascular negative cardio ROS  Rhythm:Regular Rate:Normal     Neuro/Psych    GI/Hepatic negative GI ROS,   Endo/Other    Renal/GU      Musculoskeletal  (+) Arthritis -,   Abdominal   Peds  Hematology   Anesthesia Other Findings   Reproductive/Obstetrics                             Anesthesia Physical Anesthesia Plan  ASA: II  Anesthesia Plan: General   Post-op Pain Management:    Induction: Intravenous  Airway Management Planned: Oral ETT  Additional Equipment:   Intra-op Plan:   Post-operative Plan: Extubation in OR  Informed Consent: I have reviewed the patients History and Physical, chart, labs and discussed the procedure including the risks, benefits and alternatives for the proposed anesthesia with the patient or authorized representative who has indicated his/her understanding and acceptance.     Plan Discussed with:   Anesthesia Plan Comments:         Anesthesia Quick Evaluation

## 2014-10-30 NOTE — Interval H&P Note (Signed)
History and Physical Interval Note:  10/30/2014 8:14 AM  Jacob Henderson  has presented today for surgery, with the diagnosis of cholelithiasis  The various methods of treatment have been discussed with the patient and family. After consideration of risks, benefits and other options for treatment, the patient has consented to  Procedure(s): LAPAROSCOPIC CHOLECYSTECTOMY (N/A) as a surgical intervention .  The patient's history has been reviewed, patient examined, no change in status, stable for surgery.  I have reviewed the patient's chart and labs.  Questions were answered to the patient's satisfaction.     Franky Macho A

## 2014-10-30 NOTE — Op Note (Signed)
Patient:  Jacob Henderson  DOB:  March 19, 1961  MRN:  [id number]   Preop Diagnosis:  Cholecystitis, cholelithiasis  Postop Diagnosis:  Same  Procedure:  Laparoscopic cholecystectomy  Surgeon:  Franky Macho, M.D.  Anes:  Gen.  Indications:  Patient is a 54 year old black male who presents with cholecystitis secondary to cholelithiasis. The risks and benefits of the procedure including bleeding, infection, hepatobiliary injury, and the possibility of an open procedure were fully explained to the patient, who gave informed consent.  Procedure note:  The patient was placed in the supine position. After induction of general endotracheal anesthesia, the abdomen was prepped and draped using the usual sterile technique with fluoroscopy prep. Surgical site confirmation was performed.  A supraumbilical incision was made down to the fascia. A Veress needle was introduced into the abdominal cavity and confirmation of placement was done using the saline drop test. The abdomen was then insufflated to 16 mmHg pressure. An 11 mm trocar was introduced into the abdominal cavity under direct visualization without difficulty. The patient was placed in reverse Trendelenburg position and additional 11 mm trocar was placed the epigastric region 5 mm trochars were placed the right upper quadrant and right flank regions. Liver was inspected and noted to be within normal limits. The gallbladder was retracted in a dynamic fashion in order to expose the triangle of Calot. The cystic duct was first identified. Its junction to the infundibulum was fully identified. Endoclips were placed proximally and distally on the cystic duct, and the cystic duct was divided. This was likewise done to the cystic artery. The gallbladder was freed away from the gallbladder fossa using electrocautery. The gallbladder was delivered to the epigastric trocar site using an Endo Catch bag. The gallbladder fossa was inspected and no abnormal bleeding  or bile leakage was noted. Surgicel is placed the gallbladder fossa. All fluid and air were then evacuated from the abdominal cavity prior to removal of the trochars.  All wounds were irrigated with normal saline. All wounds were injected with 0.5% Sensorcaine. The supraumbilical fascia as well as epigastric fascia were reapproximated using 0 Vicryl interrupted sutures. All skin incisions were closed using staples. Betadine ointment and dry sterile dressings were applied.  All tape and needle counts were correct the end of the procedure. The patient was extubated in the operating room and transferred to PACU in stable condition.  Complications:  None  EBL:  Minimal  Specimen:  Gallbladder

## 2014-11-02 ENCOUNTER — Encounter (HOSPITAL_COMMUNITY): Payer: Self-pay | Admitting: General Surgery

## 2014-11-12 ENCOUNTER — Encounter: Payer: Self-pay | Admitting: Internal Medicine

## 2016-09-01 DIAGNOSIS — M7061 Trochanteric bursitis, right hip: Secondary | ICD-10-CM | POA: Diagnosis not present

## 2016-12-11 DIAGNOSIS — T63441A Toxic effect of venom of bees, accidental (unintentional), initial encounter: Secondary | ICD-10-CM | POA: Diagnosis not present

## 2016-12-11 DIAGNOSIS — R600 Localized edema: Secondary | ICD-10-CM | POA: Diagnosis not present

## 2018-05-17 DIAGNOSIS — M25551 Pain in right hip: Secondary | ICD-10-CM | POA: Diagnosis not present

## 2018-05-17 DIAGNOSIS — M7061 Trochanteric bursitis, right hip: Secondary | ICD-10-CM | POA: Diagnosis not present

## 2019-05-29 ENCOUNTER — Ambulatory Visit: Payer: Managed Care, Other (non HMO) | Attending: Internal Medicine

## 2019-05-29 DIAGNOSIS — Z23 Encounter for immunization: Secondary | ICD-10-CM

## 2019-05-29 NOTE — Progress Notes (Signed)
   Covid-19 Vaccination Clinic  Name:  Jacob Henderson    MRN: [id number] DOB: 1961-03-03  05/29/2019  [name] was observed post Covid-19 immunization for 15 minutes without incident. He was provided with Vaccine Information Sheet and instruction to access the V-Safe system.   [name] was instructed to call 911 with any severe reactions post vaccine: Marland Kitchen Difficulty breathing  . Swelling of face and throat  . A fast heartbeat  . A bad rash all over body  . Dizziness and weakness   Immunizations Administered    Name Date Dose VIS Date Route   Moderna COVID-19 Vaccine 05/29/2019  8:39 AM 0.5 mL 02/18/2019 Intramuscular   Manufacturer: Moderna   Lot: [id number]   NDC: 43888-757-97

## 2019-07-01 ENCOUNTER — Ambulatory Visit: Payer: Managed Care, Other (non HMO) | Attending: Internal Medicine

## 2019-07-01 DIAGNOSIS — Z23 Encounter for immunization: Secondary | ICD-10-CM

## 2019-07-01 NOTE — Progress Notes (Signed)
   Covid-19 Vaccination Clinic  Name:  Jacob Henderson    MRN: [id number] DOB: 07/15/1960  07/01/2019  [name] was observed post Covid-19 immunization for 15 minutes without incident. He was provided with Vaccine Information Sheet and instruction to access the V-Safe system.   [name] was instructed to call 911 with any severe reactions post vaccine: . Difficulty breathing  . Swelling of face and throat  . A fast heartbeat  . A bad rash all over body  . Dizziness and weakness   Immunizations Administered    Name Date Dose VIS Date Route   Moderna COVID-19 Vaccine 07/01/2019  8:26 AM 0.5 mL 02/18/2019 Intramuscular   Manufacturer: Moderna   Lot: [id number]   NDC: 80777-273-99     

## 2019-07-01 NOTE — Progress Notes (Signed)
   Covid-19 Vaccination Clinic  Name:  Jacob Henderson    MRN: [id number] DOB: Sep 07, 1960  07/01/2019  Mr. Jacob Henderson was observed post Covid-19 immunization for 15 minutes without incident. He was provided with Vaccine Information Sheet and instruction to access the V-Safe system.   Mr. Jacob Henderson was instructed to call 911 with any severe reactions post vaccine: Marland Kitchen Difficulty breathing  . Swelling of face and throat  . A fast heartbeat  . A bad rash all over body  . Dizziness and weakness   Immunizations Administered    Name Date Dose VIS Date Route   Moderna COVID-19 Vaccine 07/01/2019  8:26 AM 0.5 mL 02/18/2019 Intramuscular   Manufacturer: Moderna   Lot: [id number]   NDC: 14481-856-31

## 2020-10-21 ENCOUNTER — Encounter: Payer: Self-pay | Admitting: Family Medicine

## 2020-12-30 ENCOUNTER — Other Ambulatory Visit: Payer: Self-pay

## 2020-12-30 ENCOUNTER — Ambulatory Visit (INDEPENDENT_AMBULATORY_CARE_PROVIDER_SITE_OTHER): Payer: 59 | Admitting: Internal Medicine

## 2020-12-30 ENCOUNTER — Encounter: Payer: Self-pay | Admitting: Internal Medicine

## 2020-12-30 VITALS — BP 138/80 | HR 77 | Ht 68.0 in | Wt 196.0 lb

## 2020-12-30 DIAGNOSIS — K219 Gastro-esophageal reflux disease without esophagitis: Secondary | ICD-10-CM

## 2020-12-30 DIAGNOSIS — R0789 Other chest pain: Secondary | ICD-10-CM

## 2020-12-30 DIAGNOSIS — I422 Other hypertrophic cardiomyopathy: Secondary | ICD-10-CM | POA: Diagnosis not present

## 2020-12-30 MED ORDER — LOSARTAN POTASSIUM 25 MG PO TABS: 25.0000 mg | ORAL_TABLET | Freq: Every day | ORAL | 3 refills | Status: AC

## 2020-12-30 NOTE — Patient Instructions (Signed)
Medication Instructions:  START: LOSARTAN 25mg  DAILY  *If you need a refill on your cardiac medications before your next appointment, please call your pharmacy*  Follow-Up: At [facility], you and your health needs are our priority.  As part of our continuing mission to provide you with exceptional heart care, we have created designated Provider Care Teams.  These Care Teams include your primary Cardiologist (physician) and Advanced Practice Providers (APPs -  Physician Assistants and Nurse Practitioners) who all work together to provide you with the care you need, when you need it.  We recommend signing up for the patient portal called "MyChart".  Sign up information is provided on this After Visit Summary.  MyChart is used to connect with patients for Virtual Visits (Telemedicine).  Patients are able to view lab/test results, encounter notes, upcoming appointments, etc.  Non-urgent messages can be sent to your provider as well.   To learn more about what you can do with MyChart, go to [facility].    Your next appointment:   1 year(s)  The format for your next appointment:   In Person  Provider:   [email or web address], MD

## 2020-12-30 NOTE — Progress Notes (Signed)
OFFICE CONSULT NOTE  Chief Complaint:  Chest pain  Primary Care Physician: Jacob Pandy, MD  HPI:  Jacob Henderson is a 60 y.o. male who is being seen today for the evaluation of chest pain at the request of Jacob Gunner, MD. This is a pleasant 60 year old former patient of mine, last seen in 2013, therefore he is considered a new patient.  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  Echocardiogram confirmed this and he has been on both beta-blocker and low-dose ARB in the past.  Recently he developed some chest pain.  He felt like the symptoms were reflux however additional work-up was recommended by his primary care provider including a stress test and echocardiogram.  An echo was performed which showed normal systolic function and no regional wall motion abnormalities.  Stress testing was not performed.  He returns today for follow-up.  He denies any recurrent symptoms.  EKG performed shows normal sinus rhythm at 77 with inferior and lateral T wave inversions, essentially unchanged from prior EKG many years ago.  PMHx:  Past Medical History:  Diagnosis Date   Abnormal EKG    Arthritis     Past Surgical History:  Procedure Laterality Date   ACHILLES TENDON REPAIR Right    [facility]   CHOLECYSTECTOMY N/A 10/30/2014   Procedure: LAPAROSCOPIC CHOLECYSTECTOMY;  Surgeon: Franky Macho, MD;  Location: [facility];  Service: General;  Laterality: N/A;   TOTAL HIP ARTHROPLASTY Left     FAMHx:  No family history on file.  No history of hypertrophic cardiomyopathy to his knowledge  SOCHx:   reports that he does not drink alcohol and does not use drugs. No history on file for tobacco use.  ALLERGIES:  No Known Allergies  ROS: Pertinent items noted in HPI and remainder of comprehensive ROS otherwise negative.  HOME MEDS: Current Outpatient  Medications on File Prior to Visit  Medication Sig Dispense Refill   amoxicillin (AMOXIL) 500 MG capsule Take by mouth.     No current facility-administered medications on file prior to visit.    LABS/IMAGING: No results found for this or any previous visit (from the past 48 hour(s)). No results found.  LIPID PANEL: No results found for: CHOL, TRIG, HDL, CHOLHDL, VLDL, LDLCALC, LDLDIRECT  WEIGHTS: Wt Readings from Last 3 Encounters:  12/30/20 196 lb (88.9 kg)  10/27/14 180 lb (81.6 kg)    VITALS: BP 138/80 (BP Location: Right Arm, Patient Position: Sitting, Cuff Size: Normal)   Pulse 77   Ht 5\' 8"  (1.727 m)   Wt 196 lb (88.9 kg)   SpO2 98%   BMI 29.80 kg/m   EXAM: General appearance: alert and no distress Neck: no carotid bruit, no JVD, and thyroid not enlarged, symmetric, no tenderness/mass/nodules Lungs: clear to auscultation bilaterally Heart: regular rate and rhythm, S1, S2 normal, no murmur, click, rub or gallop Abdomen: soft, non-tender; bowel sounds normal; no masses,  no organomegaly Extremities: extremities normal, atraumatic, no cyanosis or edema Pulses: 2+ and symmetric Skin: Skin color, texture, turgor normal. No rashes or lesions Neurologic: Grossly normal Psych: Pleasant  EKG: Normal sinus rhythm at 77, inferior and anterolateral T wave inversions- personally reviewed  ASSESSMENT: Noncardiac chest pain-suspect GERD History of apical hypertrophic cardiomyopathy Hypertension  PLAN: 1.   Mr. has likely noncardiac chest pain.  He has a history of GERD and I suspect  this episode he had was GERD.  He has a history of apical hypertrophic cardiomyopathy with an abnormal EKG that looks similar to prior studies.  Repeat echo shows no significant change in LV function or worsening apical thickening.  We discussed the possibility of repeat ischemia testing however he declined at this time.  We will continue to monitor his symptoms.  Should he develop more  chest discomfort, would recommend coronary CT angiography.  Would recommend restarting losartan 25 mg daily as his blood pressure is elevated and this should also potentially be helpful for his cardiomyopathy.  He had stopped both medications in the past.  Plan follow-up with me annually or sooner as necessary.  Chrystie Nose, MD, [facility], FACP  [place]  [facility]  Medical Director of the Advanced Lipid Disorders &  Cardiovascular Risk Reduction Clinic Diplomate of the American Board of Clinical Lipidology Attending Cardiologist  Direct Dial: [phone]  Fax: [phone]  Website:  [email or web address] 12/30/2020, 8:47 PM

## 2022-06-29 ENCOUNTER — Ambulatory Visit: Payer: 59 | Admitting: Internal Medicine

## 2022-08-11 ENCOUNTER — Ambulatory Visit: Payer: 59 | Admitting: Internal Medicine

## 2022-10-19 NOTE — Progress Notes (Signed)
Cardiology Office Note    Date:  10/20/2022  ID:  Jacob Henderson, DOB [birth year], MRN [id number] Cardiologist: Dr. Rennis Golden   History of Present Illness:    Jacob Henderson is a 62 y.o. male with past medical history of HTN and arthritis who presents to the office today for overdue follow-up.  He was examined by Dr. Rennis Golden in 12/2020 as a new patient referral for chest pain. He had recently undergone an echocardiogram which showed normal EF and wall motion. He denied any recent chest pain and the possibility of a stress test was reviewed but he declined at that time. It was recommended that if he developed recurrent chest discomfort, would plan for a Coronary CTA. Blood pressure was elevated and he was encouraged to restart Losartan 25 mg daily.  In talking with the patient today, he reports overall doing well since his last office visit. He does not exercise routinely but says he remains active on his job and in doing chores around the house. He denies any recent chest pain or dyspnea on exertion. No recent palpitations, orthopnea, PND or pitting edema. He was previously prescribed Losartan but says he never took this. He preferred to focus on dietary changes at that time. He has not had routine labs with his PCP since 2022.  Studies Reviewed:   EKG: EKG is ordered today and demonstrates:   EKG Interpretation Date/Time:  Friday October 20 2022 13:38:29 EDT Ventricular Rate:  78 PR Interval:  156 QRS Duration:  90 QT Interval:  372 QTC Calculation: 424 R Axis:   71  Text Interpretation: Normal sinus rhythm TWI along inferior and lateral leads. Similar to prior tracings and most consistent with LVH with repol. Confirmed by Randall An ([id number]) on 10/20/2022 1:41:58 PM       Echocardiogram: 09/2020 Summary   1. The left ventricle is normal in size with upper normal wall thickness.    2. The left ventricular systolic function is normal, LVEF is visually  estimated at 65-70%.    3.  There is grade I diastolic dysfunction (impaired relaxation).    4. There is mild mitral valve regurgitation.    5. The right ventricle is normal in size, with normal systolic function.   Risk Assessment/Calculations:   HYPERTENSION CONTROL Vitals:   10/20/22 1334 10/20/22 2019  BP: (!) 142/80 (!) 144/84    The patient's blood pressure is elevated above target today.  In order to address the patient's elevated BP: Blood pressure will be monitored at home to determine if medication changes need to be made.; Labs and/or other diagnostics are currently pending prior to making blood pressure medication adjustments.           Physical Exam:   VS:  BP (!) 144/84   Pulse 78   Ht 5\' 8"  (1.727 m)   Wt 195 lb 9.6 oz (88.7 kg)   SpO2 98%   BMI 29.74 kg/m    Wt Readings from Last 3 Encounters:  10/20/22 195 lb 9.6 oz (88.7 kg)  12/30/20 196 lb (88.9 kg)  10/27/14 180 lb (81.6 kg)     GEN: Well nourished, well developed male appearing in no acute distress NECK: No JVD; No carotid bruits CARDIAC: RRR, no murmurs, rubs, gallops RESPIRATORY:  Clear to auscultation without rales, wheezing or rhonchi  ABDOMEN: Appears non-distended. No obvious abdominal masses. EXTREMITIES: No clubbing or cyanosis. No pitting edema.  Distal pedal pulses are 2+ bilaterally.   Assessment and Plan:  1. History of Hypertrophic Cardiomyopathy - He has a history of this by prior echocardiogram imaging and most recent study in 09/2020 showed a preserved EF of 65 to 70% and mentioned the left ventricle was normal in size with upper normal wall thickness. EKG today is consistent with LVH with repolarization abnormalities which has been noted on prior tracings as well. Pending his BP trend, would plan to restart medical therapy if indicated as discussed below.  2. HTN - BP was elevated at 142/80 during today's visit with similar values by recheck. He says this was previously well-controlled at home but has not  checked recently. He was provided with a BP log and encouraged to return this in 2-3 week. If BP remains above goal, would plan to restart Losartan. Recheck BMET with upcoming labs.   3. Screening for Lipid Disorders - He has not had recent labs with his PCP. Will recheck an FLP.   4. Screening for Type 2 DM - Hgb A1c was at 7.3 in 2022. He has focused on diet modifications and is not currently on medical therapy. Will recheck a Hgb A1c.   Signed, Ellsworth Lennox, PA-C

## 2022-10-20 ENCOUNTER — Encounter: Payer: Self-pay | Admitting: Student

## 2022-10-20 ENCOUNTER — Ambulatory Visit: Payer: 59 | Attending: Internal Medicine | Admitting: Student

## 2022-10-20 VITALS — BP 144/84 | HR 78 | Ht 68.0 in | Wt 195.6 lb

## 2022-10-20 DIAGNOSIS — Z13220 Encounter for screening for lipoid disorders: Secondary | ICD-10-CM | POA: Diagnosis not present

## 2022-10-20 DIAGNOSIS — Z79899 Other long term (current) drug therapy: Secondary | ICD-10-CM

## 2022-10-20 DIAGNOSIS — Z131 Encounter for screening for diabetes mellitus: Secondary | ICD-10-CM

## 2022-10-20 DIAGNOSIS — I10 Essential (primary) hypertension: Secondary | ICD-10-CM

## 2022-10-20 DIAGNOSIS — I422 Other hypertrophic cardiomyopathy: Secondary | ICD-10-CM

## 2022-10-20 NOTE — Patient Instructions (Signed)
Medication Instructions:  Your physician recommends that you continue on your current medications as directed. Please refer to the Current Medication list given to you today.  Please complete Blood pressure Log for 2-3 weeks and return to office.   *If you need a refill on your cardiac medications before your next appointment, please call your pharmacy*   Lab Work: Your physician recommends that you return for lab work in: Fasting ( Hgb A1C, Lipid, CMET, CBC)   If you have labs (blood work) drawn today and your tests are completely normal, you will receive your results only by: MyChart Message (if you have MyChart) OR A paper copy in the mail If you have any lab test that is abnormal or we need to change your treatment, we will call you to review the results.   Testing/Procedures: NONE    Follow-Up: At [facility], you and your health needs are our priority.  As part of our continuing mission to provide you with exceptional heart care, we have created designated Provider Care Teams.  These Care Teams include your primary Cardiologist (physician) and Advanced Practice Providers (APPs -  Physician Assistants and Nurse Practitioners) who all work together to provide you with the care you need, when you need it.  We recommend signing up for the patient portal called "MyChart".  Sign up information is provided on this After Visit Summary.  MyChart is used to connect with patients for Virtual Visits (Telemedicine).  Patients are able to view lab/test results, encounter notes, upcoming appointments, etc.  Non-urgent messages can be sent to your provider as well.   To learn more about what you can do with MyChart, go to [email or web address].    Your next appointment:   1 year(s)  Provider:   You may see Chrystie Nose, MD or one of the following Advanced Practice Providers on your designated Care Team:   Randall An, PA-C  Jacolyn Reedy, PA-C     Other  Instructions Thank you for choosing [facility]!

## 2024-02-29 ENCOUNTER — Ambulatory Visit: Admitting: Student
